# Patient Record
Sex: Female | Born: 2000 | Race: White | Hispanic: No | Marital: Single | Smoking: Never smoker
Health system: Southern US, Community
[De-identification: ages and names within clinical notes are randomized; demographics above are authoritative.]

## PROBLEM LIST (undated history)

## (undated) DIAGNOSIS — T7840XA Allergy, unspecified, initial encounter: Secondary | ICD-10-CM

## (undated) HISTORY — DX: Allergy, unspecified, initial encounter: T78.40XA

---
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
## 2020-12-09 DIAGNOSIS — Z309 Encounter for contraceptive management, unspecified: Secondary | ICD-10-CM | POA: Diagnosis not present

## 2020-12-09 DIAGNOSIS — Z6828 Body mass index (BMI) 28.0-28.9, adult: Secondary | ICD-10-CM | POA: Diagnosis not present

## 2020-12-09 DIAGNOSIS — Z113 Encounter for screening for infections with a predominantly sexual mode of transmission: Secondary | ICD-10-CM | POA: Diagnosis not present

## 2020-12-09 DIAGNOSIS — Z01419 Encounter for gynecological examination (general) (routine) without abnormal findings: Secondary | ICD-10-CM | POA: Diagnosis not present

## 2022-03-02 DIAGNOSIS — Z113 Encounter for screening for infections with a predominantly sexual mode of transmission: Secondary | ICD-10-CM | POA: Diagnosis not present

## 2022-03-02 DIAGNOSIS — Z124 Encounter for screening for malignant neoplasm of cervix: Secondary | ICD-10-CM | POA: Diagnosis not present

## 2022-03-02 DIAGNOSIS — Z6829 Body mass index (BMI) 29.0-29.9, adult: Secondary | ICD-10-CM | POA: Diagnosis not present

## 2022-03-02 DIAGNOSIS — Z01419 Encounter for gynecological examination (general) (routine) without abnormal findings: Secondary | ICD-10-CM | POA: Diagnosis not present

## 2022-03-08 DIAGNOSIS — Z111 Encounter for screening for respiratory tuberculosis: Secondary | ICD-10-CM | POA: Diagnosis not present

## 2022-03-10 DIAGNOSIS — Z681 Body mass index (BMI) 19 or less, adult: Secondary | ICD-10-CM | POA: Diagnosis not present

## 2022-03-10 DIAGNOSIS — Z021 Encounter for pre-employment examination: Secondary | ICD-10-CM | POA: Diagnosis not present

## 2022-03-10 DIAGNOSIS — Z111 Encounter for screening for respiratory tuberculosis: Secondary | ICD-10-CM | POA: Diagnosis not present

## 2022-03-10 DIAGNOSIS — Z6829 Body mass index (BMI) 29.0-29.9, adult: Secondary | ICD-10-CM | POA: Diagnosis not present

## 2022-06-21 DIAGNOSIS — J029 Acute pharyngitis, unspecified: Secondary | ICD-10-CM | POA: Diagnosis not present

## 2022-06-21 DIAGNOSIS — Z6826 Body mass index (BMI) 26.0-26.9, adult: Secondary | ICD-10-CM | POA: Diagnosis not present

## 2022-12-20 ENCOUNTER — Ambulatory Visit (INDEPENDENT_AMBULATORY_CARE_PROVIDER_SITE_OTHER): Payer: BC Managed Care – PPO

## 2022-12-20 ENCOUNTER — Ambulatory Visit
Admission: EM | Admit: 2022-12-20 | Discharge: 2022-12-20 | Disposition: A | Discharge status: Home / Self Care | Payer: BC Managed Care – PPO | Attending: Family Medicine | Admitting: Family Medicine

## 2022-12-20 ENCOUNTER — Other Ambulatory Visit: Payer: Self-pay

## 2022-12-20 DIAGNOSIS — R0789 Other chest pain: Secondary | ICD-10-CM

## 2022-12-20 MED ORDER — IBUPROFEN 600 MG PO TABS
600.0000 mg | ORAL_TABLET | Freq: Once | ORAL | Status: AC
  Administered 2022-12-20: 600 mg via ORAL

## 2022-12-20 NOTE — Discharge Instructions (Addendum)
Advised patient of chest x-ray results with hardcopy provided advised may use OTC Ibuprofen 600 to 800 mg daily, as needed for chest wall pain.  Advised patient if symptoms worsen and/or unresolved please follow-up with PCP or here for further evaluation.

## 2022-12-20 NOTE — ED Provider Notes (Signed)
[facility]    CSN: [id number] Arrival date & time: 12/20/22  4098      History   Chief Complaint No chief complaint on file.   HPI Penny Mccoy is a 22 y.o. female.   HPI 22 year old female presents with chest wall pain since Sunday.  Patient reports pain over sternum and chest for the past 2 days.  Patient reports returning from [place] trip and noticed this coming off.  Thank you  Past Medical History:  Diagnosis Date   Allergy     Patient Active Problem List   Diagnosis Date Noted   Sinusitis 10/03/2011   Epistaxis 10/03/2011    History reviewed. No pertinent surgical history.  OB History   No obstetric history on file.      Home Medications    Prior to Admission medications   Medication Sig Start Date End Date Taking? Authorizing Provider  fluticasone (FLONASE) 50 MCG/ACT nasal spray Place 1 spray into the nose daily. 10/03/11 10/02/12  Georgiann Hahn, MD    Family History Family History  Problem Relation Age of Onset   Asthma Brother    Allergies Brother    Allergies Brother     Social History Social History   Tobacco Use   Smoking status: Never   Smokeless tobacco: Never  Substance Use Topics   Alcohol use: No   Drug use: No     Allergies   Patient has no known allergies.   Review of Systems Review of Systems   Physical Exam Triage Vital Signs ED Triage Vitals  Enc Vitals Group     BP 12/20/22 1836 114/78     Pulse Rate 12/20/22 1836 62     Resp 12/20/22 1836 17     Temp 12/20/22 1836 98.1 F (36.7 C)     Temp Source 12/20/22 1836 Oral     SpO2 12/20/22 1836 99 %     Weight --      Height --      Head Circumference --      Peak Flow --      Pain Score 12/20/22 1841 6     Pain Loc --      Pain Edu? --      Excl. in GC? --    No data found.  Updated Vital Signs BP 114/78 (BP Location: Right Arm)   Pulse 62   Temp 98.1 F (36.7 C) (Oral)   Resp 17   LMP 12/16/2022 (Approximate)   SpO2 99%    Visual Acuity Right Eye Distance:   Left Eye Distance:   Bilateral Distance:    Right Eye Near:   Left Eye Near:    Bilateral Near:     Physical Exam Vitals and nursing note reviewed.  Constitutional:      Appearance: Normal appearance. She is normal weight.  HENT:     Head: Normocephalic and atraumatic.     Mouth/Throat:     Mouth: Mucous membranes are moist.     Pharynx: Oropharynx is clear.  Eyes:     Extraocular Movements: Extraocular movements intact.     Conjunctiva/sclera: Conjunctivae normal.     Pupils: Pupils are equal, round, and reactive to light.  Cardiovascular:     Rate and Rhythm: Normal rate and regular rhythm.     Pulses: Normal pulses.     Heart sounds: Normal heart sounds.  Pulmonary:     Effort: Pulmonary effort is normal. No respiratory distress.  Breath sounds: Normal breath sounds. No stridor. No wheezing, rhonchi or rales.  Musculoskeletal:        General: Normal range of motion.     Cervical back: Normal range of motion and neck supple.  Skin:    General: Skin is warm and dry.  Neurological:     General: No focal deficit present.     Mental Status: She is alert and oriented to person, place, and time. Mental status is at baseline.  Psychiatric:        Mood and Affect: Mood normal.        Behavior: Behavior normal.        Thought Content: Thought content normal.      UC Treatments / Results  Labs (all labs ordered are listed, but only abnormal results are displayed) Labs Reviewed - No data to display  EKG   Radiology DG Chest 2 View  Result Date: 12/20/2022 CLINICAL DATA:  Chest wall pain for 2 days after being restrained on amusement park ride. EXAM: CHEST - 2 VIEW COMPARISON:  07/13/2007 FINDINGS: The heart size and mediastinal contours are within normal limits. Both lungs are clear. The visualized skeletal structures are unremarkable. IMPRESSION: No active cardiopulmonary disease. Electronically Signed   By: Burman Nieves  M.D.   On: 12/20/2022 19:23    Procedures Procedures (including critical care time)  Medications Ordered in UC Medications  ibuprofen (ADVIL) tablet 600 mg (600 mg Oral Given 12/20/22 1932)    Initial Impression / Assessment and Plan / UC Course  I have reviewed the triage vital signs and the nursing notes.  Pertinent labs & imaging results that were available during my care of the patient were reviewed by me and considered in my medical decision making (see chart for details).     MDM: 1. Chest wall pain-CXR revealed above. Advised patient of chest x-ray results with hardcopy provided advised may use OTC Ibuprofen 600 to 800 mg daily, as needed for chest wall pain.  Advised patient if symptoms worsen and/or unresolved please follow-up with PCP or here for further evaluation.  Patient discharged home, hemodynamically stable.  Final Clinical Impressions(s) / UC Diagnoses   Final diagnoses:  Chest wall pain     Discharge Instructions      Advised patient of chest x-ray results with hardcopy provided advised may use OTC Ibuprofen 600 to 800 mg daily, as needed for chest wall pain.  Advised patient if symptoms worsen and/or unresolved please follow-up with PCP or here for further evaluation.     ED Prescriptions   None    PDMP not reviewed this encounter.   Trevor Iha, FNP 12/20/22 1939

## 2022-12-20 NOTE — ED Triage Notes (Signed)
Pt c/o sternum/chest pain since Sunday. Recently returned from trip to [place]. First noticed when she got off a ride at [employer]. Tylenol prn.

## 2023-01-16 ENCOUNTER — Encounter: Payer: Self-pay | Admitting: Family Medicine

## 2023-01-16 ENCOUNTER — Ambulatory Visit: Payer: BC Managed Care – PPO | Admitting: Family Medicine

## 2023-01-16 VITALS — BP 98/64 | HR 63 | Temp 98.0°F | Resp 18 | Ht 63.0 in | Wt 163.0 lb

## 2023-01-16 DIAGNOSIS — Z111 Encounter for screening for respiratory tuberculosis: Secondary | ICD-10-CM | POA: Diagnosis not present

## 2023-01-16 DIAGNOSIS — Z23 Encounter for immunization: Secondary | ICD-10-CM

## 2023-01-16 DIAGNOSIS — Z7689 Persons encountering health services in other specified circumstances: Secondary | ICD-10-CM | POA: Diagnosis not present

## 2023-01-16 NOTE — Progress Notes (Signed)
New Patient Office Visit  Subjective    Patient ID: Penny Mccoy, female    DOB: Sep 25, 2000  Age: 22 y.o. MRN: [id number]  CC:  Chief Complaint  Patient presents with   Establish Care    Physical for school/ patient also states that she needs a TB skin test done as well    HPI Penny Mccoy presents to establish care.  Pt is here for physical for school. She has school form needing to be completed. Needs TB skin test and Tdap vaccine today Up to date with pap smear, received in Sept 2023. She declines routine lab work today. Has seasonal allergies and using occasional flonase and zyrtec prn.   Outpatient Encounter Medications as of 01/16/2023  Medication Sig   fluticasone (FLONASE) 50 MCG/ACT nasal spray Place 1 spray into the nose daily.   No facility-administered encounter medications on file as of 01/16/2023.    Past Medical History:  Diagnosis Date   Allergy     History reviewed. No pertinent surgical history.  Family History  Problem Relation Age of Onset   Allergies Brother    Asthma Brother    Allergies Brother     Social History   Socioeconomic History   Marital status: Single    Spouse name: Not on file   Number of children: Not on file   Years of education: Not on file   Highest education level: Not on file  Occupational History   Not on file  Tobacco Use   Smoking status: Never    Passive exposure: Never   Smokeless tobacco: Never  Vaping Use   Vaping Use: Never used  Substance and Sexual Activity   Alcohol use: Yes    Comment: socially   Drug use: Never   Sexual activity: Never  Other Topics Concern   Not on file  Social History Narrative   [employer]   5 th grade.   Social Determinants of Health   Financial Resource Strain: Not on file  Food Insecurity: Not on file  Transportation Needs: Not on file  Physical Activity: Not on file  Stress: Not on file  Social Connections: Not on file  Intimate Partner  Violence: Not on file    Review of Systems  All other systems reviewed and are negative.      Objective    BP 98/64   Pulse 63   Temp 98 F (36.7 C) (Oral)   Resp 18   Ht 5\' 3"  (1.6 m)   Wt 163 lb (73.9 kg)   LMP 01/16/2023 (Exact Date)   SpO2 98%   BMI 28.87 kg/m   Physical Exam Vitals and nursing note reviewed.  Constitutional:      Appearance: Normal appearance. She is normal weight.  HENT:     Head: Normocephalic and atraumatic.     Right Ear: External ear normal.     Left Ear: External ear normal.     Nose: Nose normal.     Mouth/Throat:     Mouth: Mucous membranes are moist.     Pharynx: Oropharynx is clear.  Eyes:     Conjunctiva/sclera: Conjunctivae normal.     Pupils: Pupils are equal, round, and reactive to light.  Cardiovascular:     Rate and Rhythm: Regular rhythm. Bradycardia present.     Pulses: Normal pulses.     Heart sounds: Normal heart sounds.  Pulmonary:     Effort: Pulmonary effort is normal.     Breath  sounds: Normal breath sounds.  Abdominal:     General: Abdomen is flat.  Skin:    General: Skin is warm.     Capillary Refill: Capillary refill takes less than 2 seconds.  Neurological:     General: No focal deficit present.     Mental Status: She is alert and oriented to person, place, and time. Mental status is at baseline.  Psychiatric:        Mood and Affect: Mood normal.        Behavior: Behavior normal.        Thought Content: Thought content normal.        Judgment: Judgment normal.       Assessment & Plan:   Problem List Items Addressed This Visit   None  Encounter to establish care with new doctor  Need for Tdap vaccination -     Tdap vaccine greater than or equal to 7yo IM  Screening-pulmonary TB -     TB Skin Test  School form completed today Tdap vaccine and TB skin test today Return on Wednesday to have TB skin test read Advised to return soon for CPE and labs. Pt declines today; states she passes out and  needs someone with her.  No follow-ups on file.   Penny Slick, MD

## 2023-01-18 ENCOUNTER — Ambulatory Visit (INDEPENDENT_AMBULATORY_CARE_PROVIDER_SITE_OTHER): Payer: BC Managed Care – PPO | Admitting: Family Medicine

## 2023-01-18 VITALS — BP 114/80 | HR 74 | Temp 97.7°F | Resp 20 | Ht 63.0 in | Wt 162.0 lb

## 2023-01-18 DIAGNOSIS — Z111 Encounter for screening for respiratory tuberculosis: Secondary | ICD-10-CM | POA: Diagnosis not present

## 2023-01-18 LAB — TB SKIN TEST
Induration: 0 mm
TB Skin Test: NEGATIVE

## 2023-01-18 NOTE — Progress Notes (Signed)
   Subjective:    Patient ID: Penny Mccoy, female    DOB: [birth year], 22 y.o.   MRN: [id number]  HPI Patient is here for PPD reading   Review of Systems     Objective:   Physical Exam        Assessment & Plan:   Results were negative

## 2023-01-18 NOTE — Progress Notes (Signed)
TB skin test negative.

## 2023-04-10 DIAGNOSIS — Z6827 Body mass index (BMI) 27.0-27.9, adult: Secondary | ICD-10-CM | POA: Diagnosis not present

## 2023-04-10 DIAGNOSIS — Z113 Encounter for screening for infections with a predominantly sexual mode of transmission: Secondary | ICD-10-CM | POA: Diagnosis not present

## 2023-04-10 DIAGNOSIS — Z01419 Encounter for gynecological examination (general) (routine) without abnormal findings: Secondary | ICD-10-CM | POA: Diagnosis not present

## 2024-03-15 ENCOUNTER — Ambulatory Visit
Admission: EM | Admit: 2024-03-15 | Discharge: 2024-03-15 | Disposition: A | Discharge status: Home / Self Care | Attending: Family Medicine | Admitting: Family Medicine

## 2024-03-15 ENCOUNTER — Other Ambulatory Visit: Payer: Self-pay

## 2024-03-15 DIAGNOSIS — H6692 Otitis media, unspecified, left ear: Secondary | ICD-10-CM | POA: Diagnosis not present

## 2024-03-15 DIAGNOSIS — J069 Acute upper respiratory infection, unspecified: Secondary | ICD-10-CM | POA: Diagnosis not present

## 2024-03-15 LAB — POC SARS CORONAVIRUS 2 AG -  ED: SARS Coronavirus 2 Ag: NEGATIVE

## 2024-03-15 MED ORDER — ONDANSETRON 4 MG PO TBDP
4.0000 mg | ORAL_TABLET | Freq: Once | ORAL | Status: AC
  Administered 2024-03-15: 4 mg via ORAL

## 2024-03-15 MED ORDER — AMOXICILLIN 875 MG PO TABS: 875.0000 mg | ORAL_TABLET | Freq: Two times a day (BID) | ORAL | 0 refills | Status: AC

## 2024-03-15 NOTE — Discharge Instructions (Signed)
Take plain guaifenesin (1200mg extended release tabs such as Mucinex) twice daily, with plenty of water, for cough and congestion.  May add Pseudoephedrine (30mg, one or two every 4 to 6 hours) for sinus congestion.  Get adequate rest.   May use Afrin nasal spray (or generic oxymetazoline) each morning for about 5 days and then discontinue.  Also recommend using saline nasal spray several times daily and saline nasal irrigation (AYR is a common brand).  Use Flonase nasal spray each morning after using Afrin nasal spray and saline nasal irrigation. Try warm salt water gargles for sore throat.  Stop all antihistamines (Nyquil, etc) for now, and other non-prescription cough/cold preparations. May take Delsym Cough Suppressant ("12 Hour Cough Relief") at bedtime for nighttime cough.  

## 2024-03-15 NOTE — ED Provider Notes (Signed)
 [facility]    CSN: [id number] Arrival date & time: 03/15/24  1815      History   Chief Complaint Chief Complaint  Patient presents with   Abdominal Pain   Sore Throat    HPI Penny Mccoy is a 23 y.o. female.   Yesterday patient awoke with a sore throat.  Today her sore throat had improved, and she had also developed a mild cough, sinus pressure, fatigue, mild shortness of breath, and chills.  She vomited once but continues to have nausea.  She denies shortness of breath and pleuritic pain.  The history is provided by the patient.    Past Medical History:  Diagnosis Date   Allergy     Patient Active Problem List   Diagnosis Date Noted   Sinusitis 10/03/2011   Epistaxis 10/03/2011    History reviewed. No pertinent surgical history.  OB History   No obstetric history on file.      Home Medications    Prior to Admission medications   Medication Sig Start Date End Date Taking? Authorizing Provider  amoxicillin  (AMOXIL ) 875 MG tablet Take 1 tablet (875 mg total) by mouth 2 (two) times daily. 03/15/24  Yes Pauline Garnette LABOR, MD    Family History Family History  Problem Relation Age of Onset   Allergies Brother    Asthma Brother    Allergies Brother     Social History Social History   Tobacco Use   Smoking status: Never    Passive exposure: Never   Smokeless tobacco: Never  Vaping Use   Vaping status: Never Used  Substance Use Topics   Alcohol use: Not Currently    Comment: socially   Drug use: Never     Allergies   Patient has no known allergies.   Review of Systems Review of Systems + sore throat + cough No pleuritic pain No wheezing + nasal congestion + post-nasal drainage + sinus pain/pressure No itchy/red eyes No earache No hemoptysis No SOB No fever, + chills + nausea + vomiting (resolved) No abdominal pain No diarrhea No urinary symptoms No skin rash + fatigue + myalgias + headache Used OTC meds  (Dayquil, Nyquil) without relief   Physical Exam Triage Vital Signs ED Triage Vitals  Encounter Vitals Group     BP 03/15/24 1835 114/74     Girls Systolic BP Percentile --      Girls Diastolic BP Percentile --      Boys Systolic BP Percentile --      Boys Diastolic BP Percentile --      Pulse Rate 03/15/24 1835 84     Resp 03/15/24 1835 16     Temp 03/15/24 1835 98.3 F (36.8 C)     Temp src --      SpO2 03/15/24 1835 96 %     Weight --      Height --      Head Circumference --      Peak Flow --      Pain Score 03/15/24 1838 3     Pain Loc --      Pain Education --      Exclude from Growth Chart --    No data found.  Updated Vital Signs BP 114/74   Pulse 84   Temp 98.3 F (36.8 C)   Resp 16   LMP 03/04/2024 (Exact Date)   SpO2 96%   Visual Acuity Right Eye Distance:   Left Eye Distance:  Bilateral Distance:    Right Eye Near:   Left Eye Near:    Bilateral Near:     Physical Exam Nursing notes and Vital Signs reviewed. Appearance:  Patient appears stated age, and in no acute distress Eyes:  Pupils are equal, round, and reactive to light and accomodation.  Extraocular movement is intact.  Conjunctivae are not inflamed  Ears:  Canals normal. Left tympanic membrane is diffusely erythematous.  Right tympanic membrane is normal. Nose:  Congested turbinates.  No sinus tenderness.   Pharynx:  Normal Neck:  Supple. No adenopathy.  Lungs:  Clear to auscultation.  Breath sounds are equal.  Moving air well. Heart:  Regular rate and rhythm without murmurs, rubs, or gallops.  Abdomen:  Nontender without masses or hepatosplenomegaly.  Bowel sounds are present.  No CVA or flank tenderness.  Extremities:  No edema.  Skin:  No rash present.   UC Treatments / Results  Labs (all labs ordered are listed, but only abnormal results are displayed) Labs Reviewed  POC SARS CORONAVIRUS 2 AG -  ED negative    EKG   Radiology No results found.  Procedures Procedures  (including critical care time)  Medications Ordered in UC Medications  ondansetron  (ZOFRAN -ODT) disintegrating tablet 4 mg (has no administration in time range)    Initial Impression / Assessment and Plan / UC Course  I have reviewed the triage vital signs and the nursing notes.  Pertinent labs & imaging results that were available during my care of the patient were reviewed by me and considered in my medical decision making (see chart for details).    Administered Zofran  ODT 4mg  PO   Begin amoxicillin  875 BID for one week. Followup with Family Doctor if not improved in one week.   Final Clinical Impressions(s) / UC Diagnoses   Final diagnoses:  Viral URI with cough  Acute left otitis media     Discharge Instructions      Take plain guaifenesin (1200mg  extended release tabs such as Mucinex) twice daily, with plenty of water, for cough and congestion.  May add Pseudoephedrine (30mg , one or two every 4 to 6 hours) for sinus congestion.  Get adequate rest.   May use Afrin nasal spray (or generic oxymetazoline) each morning for about 5 days and then discontinue.  Also recommend using saline nasal spray several times daily and saline nasal irrigation (AYR is a common brand).  Use Flonase  nasal spray each morning after using Afrin nasal spray and saline nasal irrigation. Try warm salt water gargles for sore throat.  Stop all antihistamines (Nyquil, etc) for now, and other non-prescription cough/cold preparations. May take Delsym Cough Suppressant (12 Hour Cough Relief) at bedtime for nighttime cough.      ED Prescriptions     Medication Sig Dispense Auth. Provider   amoxicillin  (AMOXIL ) 875 MG tablet Take 1 tablet (875 mg total) by mouth 2 (two) times daily. 14 tablet Pauline Garnette LABOR, MD         Pauline Garnette LABOR, MD 03/16/24 [phone]

## 2024-03-15 NOTE — ED Triage Notes (Signed)
 Ha, sinus pressure, vomiting, abd pain, nausea, sore throat. Woke up with sore throat yesterday, other symptoms have occurred today. Has vomited one time. No fever. Has had dayquil, nyquil.

## 2024-04-19 DIAGNOSIS — Z6830 Body mass index (BMI) 30.0-30.9, adult: Secondary | ICD-10-CM | POA: Diagnosis not present

## 2024-04-19 DIAGNOSIS — Z01419 Encounter for gynecological examination (general) (routine) without abnormal findings: Secondary | ICD-10-CM | POA: Diagnosis not present
